# Patient Record
Sex: Female | Born: 2000 | Race: White | Hispanic: No | Marital: Single | Smoking: Never smoker
Health system: Southern US, Community
[De-identification: ages and names within clinical notes are randomized; demographics above are authoritative.]

## PROBLEM LIST (undated history)

## (undated) DIAGNOSIS — T7840XA Allergy, unspecified, initial encounter: Secondary | ICD-10-CM

## (undated) HISTORY — PX: NO PAST SURGERIES: SHX2092

---
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
## 2020-08-08 ENCOUNTER — Other Ambulatory Visit: Payer: Self-pay

## 2020-08-08 ENCOUNTER — Emergency Department (HOSPITAL_COMMUNITY)
Admission: EM | Admit: 2020-08-08 | Discharge: 2020-08-09 | Disposition: A | Discharge status: Home / Self Care | Payer: PPO | Attending: Emergency Medicine | Admitting: Emergency Medicine

## 2020-08-08 ENCOUNTER — Encounter (HOSPITAL_COMMUNITY): Payer: Self-pay

## 2020-08-08 ENCOUNTER — Emergency Department (HOSPITAL_COMMUNITY): Payer: PPO

## 2020-08-08 DIAGNOSIS — R079 Chest pain, unspecified: Secondary | ICD-10-CM | POA: Diagnosis present

## 2020-08-08 NOTE — ED Triage Notes (Signed)
Pt reports intermittent chest pain x2 weeks. Pt reports severe chest pain that began at 10p today. Pt denies abdominal pain and N/V/D.

## 2020-08-08 NOTE — ED Provider Notes (Signed)
MSE was initiated and I personally evaluated the patient and placed orders (if any) at  11:09 PM on August 08, 2020.  Patient with CP that started after driving [facility]  Has had gradually worsening/more frequent symptoms.  No medical problems.  No leg swelling.  No cough or fever.  Discussed with patient that their care has been initiated.   They are counseled that they will need to remain in the ED until the completion of their workup, including full H&P and results of any tests.  Risks of leaving the emergency department prior to completion of treatment were discussed. Patient was advised to inform ED staff if they are leaving before their treatment is complete. The patient acknowledged these risks and time was allowed for questions.    The patient appears stable so that the remainder of the MSE may be completed by another provider.    Roxy Horseman, PA-C 08/08/20 2309    Milagros Loll, MD 08/10/20 [phone]

## 2020-08-08 NOTE — ED Provider Notes (Signed)
[facility]-EMERGENCY DEPT Provider Note   CSN: [id number] Arrival date & time: 08/08/20  2240     History Chief Complaint  Patient presents with  . Chest Pain    Susan Russo is a 20 y.o. female.  Patient presents to the ED with a chief complaint of chest pain.  She states she has had the pain intermittently for the past couple of weeks.  She first noticed it after driving to [facility].  She states that it has been more frequent and worsened today.  She denies SOB, cough, or fever.  Denies hx of PE. Doesn't use birth control.  She doesn't associated the pain with anything.  Nothing makes it better or worse.  The history is provided by the patient. No language interpreter was used.       Past Medical History:  Diagnosis Date  . Allergy     Patient Active Problem List   Diagnosis Date Noted  . Sinus headache 02/08/2018  . Frequent headaches 02/08/2018  . Right foot pain 11/03/2017  . Pain in right shin 08/19/2017  . Seasonal allergic rhinitis due to pollen 12/08/2016  . Right-sided Bell's palsy 12/02/2016  . Family history of scoliosis 04/11/2013    Past Surgical History:  Procedure Laterality Date  . NO PAST SURGERIES       OB History   No obstetric history on file.     Family History  Problem Relation Age of Onset  . Anxiety disorder Mother   . Cancer Maternal Grandmother   . Migraines Maternal Grandmother   . Cancer Maternal Grandfather   . Cancer Paternal Grandfather   . Seizures Neg Hx   . Autism Neg Hx   . ADD / ADHD Neg Hx   . Depression Neg Hx   . Bipolar disorder Neg Hx   . Schizophrenia Neg Hx     Social History   Tobacco Use  . Smoking status: Never Smoker  . Smokeless tobacco: Never Used  Substance Use Topics  . Alcohol use: No    Home Medications Prior to Admission medications   Medication Sig Start Date End Date Taking? Authorizing Provider  acetaminophen (TYLENOL) 500 MG tablet Take 500 mg by  mouth every 6 (six) hours as needed for mild pain.    [provider]  azithromycin (ZITHROMAX) 200 MG/5ML suspension Take 200 mg by mouth daily.    [provider]  cetirizine (ZYRTEC) 10 MG tablet Take 10 mg by mouth as needed for allergies.  08/16/17   [provider]  fluticasone (FLONASE) 50 MCG/ACT nasal spray Place 2 sprays into both nostrils as needed for allergies.  08/14/17   [provider]  ibuprofen (ADVIL,MOTRIN) 200 MG tablet Take 400 mg by mouth every 6 (six) hours as needed for moderate pain.    [provider]  Pediatric Multivit-Minerals-C (CHILDRENS MULTIVITAMIN) 60 MG CHEW Chew 2 tablets by mouth at bedtime.    [provider]    Allergies    Patient has no known allergies.  Review of Systems   Review of Systems  All other systems reviewed and are negative.   Physical Exam Updated Vital Signs There were no vitals taken for this visit.  Physical Exam Vitals and nursing note reviewed.  Constitutional:      General: She is not in acute distress.    Appearance: She is well-developed.  HENT:     Head: Normocephalic and atraumatic.  Eyes:     Conjunctiva/sclera:  Conjunctivae normal.  Cardiovascular:     Rate and Rhythm: Normal rate and regular rhythm.     Heart sounds: No murmur heard.   Pulmonary:     Effort: Pulmonary effort is normal. No respiratory distress.     Breath sounds: Normal breath sounds.  Abdominal:     Palpations: Abdomen is soft.     Tenderness: There is no abdominal tenderness.  Musculoskeletal:        General: Normal range of motion.     Cervical back: Neck supple.  Skin:    General: Skin is warm and dry.  Neurological:     Mental Status: She is alert and oriented to person, place, and time.  Psychiatric:        Mood and Affect: Mood normal.        Behavior: Behavior normal.     ED Results / Procedures / Treatments   Labs (all labs ordered are listed, but only abnormal results  are displayed) Labs Reviewed  CBC WITH DIFFERENTIAL/PLATELET  BASIC METABOLIC PANEL  D-DIMER, QUANTITATIVE  HCG, QUANTITATIVE, PREGNANCY    EKG None  Radiology DG Chest 2 View  Result Date: 08/08/2020 CLINICAL DATA:  Chest pain EXAM: CHEST - 2 VIEW COMPARISON:  None. FINDINGS: The heart size and mediastinal contours are within normal limits. Both lungs are clear. The visualized skeletal structures are unremarkable. IMPRESSION: Normal study. Electronically Signed   By: Charlett Nose M.D.   On: 08/08/2020 23:39    Procedures Procedures   Medications Ordered in ED Medications - No data to display  ED Course  I have reviewed the triage vital signs and the nursing notes.  Pertinent labs & imaging results that were available during my care of the patient were reviewed by me and considered in my medical decision making (see chart for details).    MDM Rules/Calculators/A&P                          This patient complains of CP, this involves an extensive number of treatment options, and is a complaint that carries with it a high risk of complications and morbidity.    Differential Dx Anxiety, GERD, PE, pneumonia, covid  Pertinent Labs I ordered, reviewed, and interpreted labs, which included CBC normal, BMP is normal, hCG negative, D-dimer negative.  Imaging Interpretation I ordered imaging studies which included CXR.  I independently visualized and interpreted the CXR, which showed no obvious abnormality.    Reassessments After the interventions stated above, I reevaluated the patient and found feeling improved, no longer complaining of any pain.   Plan Discharge with outpatient follow-up.  Unclear explanation for the patient's symptoms, could be anxiety or GERD, but no evidence of serious chest pain etiology tonight.  Feel that patient is stable for discharge.     Final Clinical Impression(s) / ED Diagnoses Final diagnoses:  Nonspecific chest pain    Rx / DC  Orders ED Discharge Orders    None       Roxy Horseman, PA-C 08/09/20 0126    Melene Plan, DO 08/09/20 [id number]

## 2020-08-09 LAB — CBC WITH DIFFERENTIAL/PLATELET
Abs Immature Granulocytes: 0.01 10*3/uL (ref 0.00–0.07)
Basophils Absolute: 0 10*3/uL (ref 0.0–0.1)
Basophils Relative: 0 %
Eosinophils Absolute: 0.2 10*3/uL (ref 0.0–0.5)
Eosinophils Relative: 4 %
HCT: 37.2 % (ref 36.0–46.0)
Hemoglobin: 12.7 g/dL (ref 12.0–15.0)
Immature Granulocytes: 0 %
Lymphocytes Relative: 32 %
Lymphs Abs: 1.8 10*3/uL (ref 0.7–4.0)
MCH: 31.6 pg (ref 26.0–34.0)
MCHC: 34.1 g/dL (ref 30.0–36.0)
MCV: 92.5 fL (ref 80.0–100.0)
Monocytes Absolute: 0.5 10*3/uL (ref 0.1–1.0)
Monocytes Relative: 8 %
Neutro Abs: 3.1 10*3/uL (ref 1.7–7.7)
Neutrophils Relative %: 56 %
Platelets: 214 10*3/uL (ref 150–400)
RBC: 4.02 MIL/uL (ref 3.87–5.11)
RDW: 11.9 % (ref 11.5–15.5)
WBC: 5.6 10*3/uL (ref 4.0–10.5)
nRBC: 0 % (ref 0.0–0.2)

## 2020-08-09 LAB — BASIC METABOLIC PANEL
Anion gap: 7 (ref 5–15)
BUN: 16 mg/dL (ref 6–20)
CO2: 23 mmol/L (ref 22–32)
Calcium: 9.3 mg/dL (ref 8.9–10.3)
Chloride: 108 mmol/L (ref 98–111)
Creatinine, Ser: 0.97 mg/dL (ref 0.44–1.00)
GFR, Estimated: >60 mL/min (ref >60)
Glucose, Bld: 111 mg/dL — ABNORMAL HIGH (ref 70–99)
Potassium: 3.5 mmol/L (ref 3.5–5.1)
Sodium: 138 mmol/L (ref 135–145)

## 2020-08-09 LAB — HCG, QUANTITATIVE, PREGNANCY: hCG, Beta Chain, Quant, S: <1 m[IU]/mL (ref <5)

## 2020-08-09 LAB — D-DIMER, QUANTITATIVE: D-Dimer, Quant: 0.4 ug/mL-FEU (ref 0.00–0.50)

## 2020-09-09 ENCOUNTER — Encounter (HOSPITAL_BASED_OUTPATIENT_CLINIC_OR_DEPARTMENT_OTHER): Payer: Self-pay | Admitting: Emergency Medicine

## 2020-09-09 ENCOUNTER — Emergency Department (HOSPITAL_BASED_OUTPATIENT_CLINIC_OR_DEPARTMENT_OTHER)
Admission: EM | Admit: 2020-09-09 | Discharge: 2020-09-09 | Disposition: A | Discharge status: Home / Self Care | Payer: Medicaid Other | Attending: Emergency Medicine | Admitting: Emergency Medicine

## 2020-09-09 ENCOUNTER — Other Ambulatory Visit: Payer: Self-pay

## 2020-09-09 ENCOUNTER — Emergency Department (HOSPITAL_BASED_OUTPATIENT_CLINIC_OR_DEPARTMENT_OTHER): Payer: Medicaid Other

## 2020-09-09 DIAGNOSIS — R42 Dizziness and giddiness: Secondary | ICD-10-CM | POA: Insufficient documentation

## 2020-09-09 DIAGNOSIS — H538 Other visual disturbances: Secondary | ICD-10-CM | POA: Insufficient documentation

## 2020-09-09 DIAGNOSIS — R55 Syncope and collapse: Secondary | ICD-10-CM | POA: Diagnosis not present

## 2020-09-09 DIAGNOSIS — R109 Unspecified abdominal pain: Secondary | ICD-10-CM | POA: Insufficient documentation

## 2020-09-09 DIAGNOSIS — R079 Chest pain, unspecified: Secondary | ICD-10-CM

## 2020-09-09 DIAGNOSIS — R519 Headache, unspecified: Secondary | ICD-10-CM | POA: Insufficient documentation

## 2020-09-09 LAB — COMPREHENSIVE METABOLIC PANEL
ALT: 5 U/L (ref 0–44)
AST: 13 U/L — ABNORMAL LOW (ref 15–41)
Albumin: 4 g/dL (ref 3.5–5.0)
Alkaline Phosphatase: 37 U/L — ABNORMAL LOW (ref 38–126)
Anion gap: 9 (ref 5–15)
BUN: 14 mg/dL (ref 6–20)
CO2: 23 mmol/L (ref 22–32)
Calcium: 9.1 mg/dL (ref 8.9–10.3)
Chloride: 105 mmol/L (ref 98–111)
Creatinine, Ser: 0.72 mg/dL (ref 0.44–1.00)
GFR, Estimated: >60 mL/min (ref >60)
Glucose, Bld: 97 mg/dL (ref 70–99)
Potassium: 3.7 mmol/L (ref 3.5–5.1)
Sodium: 137 mmol/L (ref 135–145)
Total Bilirubin: 0.6 mg/dL (ref 0.3–1.2)
Total Protein: 6.1 g/dL — ABNORMAL LOW (ref 6.5–8.1)

## 2020-09-09 LAB — CBC WITH DIFFERENTIAL/PLATELET
Abs Immature Granulocytes: 0.01 10*3/uL (ref 0.00–0.07)
Basophils Absolute: 0 10*3/uL (ref 0.0–0.1)
Basophils Relative: 0 %
Eosinophils Absolute: 0.2 10*3/uL (ref 0.0–0.5)
Eosinophils Relative: 4 %
HCT: 35.4 % — ABNORMAL LOW (ref 36.0–46.0)
Hemoglobin: 12.2 g/dL (ref 12.0–15.0)
Immature Granulocytes: 0 %
Lymphocytes Relative: 26 %
Lymphs Abs: 1.3 10*3/uL (ref 0.7–4.0)
MCH: 31.2 pg (ref 26.0–34.0)
MCHC: 34.5 g/dL (ref 30.0–36.0)
MCV: 90.5 fL (ref 80.0–100.0)
Monocytes Absolute: 0.4 10*3/uL (ref 0.1–1.0)
Monocytes Relative: 7 %
Neutro Abs: 3.1 10*3/uL (ref 1.7–7.7)
Neutrophils Relative %: 63 %
Platelets: 209 10*3/uL (ref 150–400)
RBC: 3.91 MIL/uL (ref 3.87–5.11)
RDW: 11.8 % (ref 11.5–15.5)
WBC: 4.9 10*3/uL (ref 4.0–10.5)
nRBC: 0 % (ref 0.0–0.2)

## 2020-09-09 LAB — URINALYSIS, ROUTINE W REFLEX MICROSCOPIC
Bilirubin Urine: NEGATIVE
Glucose, UA: NEGATIVE mg/dL
Hgb urine dipstick: NEGATIVE
Ketones, ur: NEGATIVE mg/dL
Nitrite: NEGATIVE
Protein, ur: NEGATIVE mg/dL
Specific Gravity, Urine: 1.01 (ref 1.005–1.030)
pH: 6 (ref 5.0–8.0)

## 2020-09-09 LAB — URINALYSIS, MICROSCOPIC (REFLEX): RBC / HPF: NONE SEEN RBC/hpf (ref 0–5)

## 2020-09-09 LAB — PREGNANCY, URINE: Preg Test, Ur: NEGATIVE

## 2020-09-09 MED ORDER — SODIUM CHLORIDE 0.9 % IV BOLUS
1000.0000 mL | Freq: Once | INTRAVENOUS | Status: AC
  Administered 2020-09-09: 1000 mL via INTRAVENOUS

## 2020-09-09 NOTE — Discharge Instructions (Addendum)
You were seen in the emergency department for evaluation of lightheadedness like you might pass out with associated sweats and nausea.  You have also had some ongoing left and right-sided chest pain.  You had blood work EKG and a chest x-ray that did not show an obvious explanation for your symptoms.  Please contact your primary care doctor for close follow-up.  Return to the emergency department for any worsening or concerning symptoms

## 2020-09-09 NOTE — ED Triage Notes (Signed)
Pt arrives to ED with c/o awaking with left sided chest pain, cold sweats, blurred vision, headache, and anxiety. Pt states these symptoms lasted 10 minutes. Pt states recent dx of GAD and MDD and started on Buspirone. Pt states dull pain to left and right flanks over last month.

## 2020-09-09 NOTE — ED Provider Notes (Signed)
[facility] EMERGENCY DEPT Provider Note   CSN: [id number] Arrival date & time: 09/09/20  8592     History Chief Complaint  Patient presents with  . Flank Pain  . Chest Pain    Susan Russo is a 20 y.o. female.  She is presenting with a complaint of acute onset onset of lightheadedness blurry vision, feeling like she was, pass out, diaphoresis when she sat up from sleeping this morning.  She said this lasted about 10 minutes.  The last month and a half she has been troubled by some right-sided flank pain and right chest pain along with some left chest pain.  She is recently been put on buspirone and Robaxin and has been to the doctors multiple times during this period.  No fevers cough nausea vomiting diarrhea constipation or urinary symptoms.  Last menstrual period was 1 month ago.  The history is provided by the patient.  Flank Pain This is a new problem. Episode onset: 6 weeks. The problem occurs every several days. The problem has not changed since onset.Associated symptoms include chest pain and headaches. Pertinent negatives include no abdominal pain and no shortness of breath. Nothing aggravates the symptoms. Nothing relieves the symptoms. She has tried nothing for the symptoms. The treatment provided no relief.  Near Syncope This is a new problem. The current episode started 3 to 5 hours ago. The problem has been resolved. Associated symptoms include chest pain and headaches. Pertinent negatives include no abdominal pain and no shortness of breath. Nothing aggravates the symptoms. Nothing relieves the symptoms. She has tried rest for the symptoms. The treatment provided significant relief.       Past Medical History:  Diagnosis Date  . Allergy     Patient Active Problem List   Diagnosis Date Noted  . Sinus headache 02/08/2018  . Frequent headaches 02/08/2018  . Right foot pain 11/03/2017  . Pain in right shin 08/19/2017  . Seasonal allergic rhinitis  due to pollen 12/08/2016  . Right-sided Bell's palsy 12/02/2016  . Family history of scoliosis 04/11/2013    Past Surgical History:  Procedure Laterality Date  . NO PAST SURGERIES       OB History   No obstetric history on file.     Family History  Problem Relation Age of Onset  . Anxiety disorder Mother   . Cancer Maternal Grandmother   . Migraines Maternal Grandmother   . Cancer Maternal Grandfather   . Cancer Paternal Grandfather   . Seizures Neg Hx   . Autism Neg Hx   . ADD / ADHD Neg Hx   . Depression Neg Hx   . Bipolar disorder Neg Hx   . Schizophrenia Neg Hx     Social History   Tobacco Use  . Smoking status: Never Smoker  . Smokeless tobacco: Never Used  Substance Use Topics  . Alcohol use: No    Home Medications Prior to Admission medications   Medication Sig Start Date End Date Taking? Authorizing Provider  busPIRone (BUSPAR) 10 MG tablet Take 5-10 mg by mouth 2 (two) times daily as needed. 08/20/20  Yes [provider]  methocarbamol (ROBAXIN) 500 MG tablet Take 1-2 tablets by mouth 4 (four) times daily. 08/28/20  Yes [provider]  acetaminophen (TYLENOL) 500 MG tablet Take 500 mg by mouth every 6 (six) hours as needed for mild pain.    [provider]  azithromycin (ZITHROMAX) 200 MG/5ML suspension Take 200 mg by mouth daily.  [provider]  cetirizine (ZYRTEC) 10 MG tablet Take 10 mg by mouth as needed for allergies.  08/16/17   [provider]  fluticasone (FLONASE) 50 MCG/ACT nasal spray Place 2 sprays into both nostrils as needed for allergies.  08/14/17   [provider]  ibuprofen (ADVIL,MOTRIN) 200 MG tablet Take 400 mg by mouth every 6 (six) hours as needed for moderate pain.    [provider]  Pediatric Multivit-Minerals-C (CHILDRENS MULTIVITAMIN) 60 MG CHEW Chew 2 tablets by mouth at bedtime.    [provider]    Allergies    Patient has no known allergies.  Review  of Systems   Review of Systems  Constitutional: Positive for diaphoresis. Negative for fever.  HENT: Negative for sore throat.   Eyes: Positive for visual disturbance.  Respiratory: Negative for shortness of breath.   Cardiovascular: Positive for chest pain and near-syncope.  Gastrointestinal: Negative for abdominal pain.  Genitourinary: Positive for flank pain. Negative for dysuria.  Musculoskeletal: Positive for back pain.  Skin: Negative for rash.  Neurological: Positive for headaches.    Physical Exam Updated Vital Signs BP 113/77 (BP Location: Left Arm)   Pulse 77   Temp 98.4 F (36.9 C) (Oral)   Resp 14   Ht 5\' 5"  (1.651 m)   Wt 52.6 kg   SpO2 100%   BMI 19.30 kg/m   Physical Exam Vitals and nursing note reviewed.  Constitutional:      General: She is not in acute distress.    Appearance: Normal appearance. She is well-developed.  HENT:     Head: Normocephalic and atraumatic.  Eyes:     Conjunctiva/sclera: Conjunctivae normal.  Cardiovascular:     Rate and Rhythm: Normal rate and regular rhythm.     Heart sounds: Normal heart sounds. No murmur heard.   Pulmonary:     Effort: Pulmonary effort is normal. No respiratory distress.     Breath sounds: Normal breath sounds.  Abdominal:     Palpations: Abdomen is soft.     Tenderness: There is no abdominal tenderness.  Musculoskeletal:        General: Normal range of motion.     Cervical back: Neck supple.     Right lower leg: No edema.     Left lower leg: No edema.  Skin:    General: Skin is warm and dry.     Capillary Refill: Capillary refill takes less than 2 seconds.  Neurological:     General: No focal deficit present.     Mental Status: She is alert and oriented to person, place, and time.     Gait: Gait normal.     ED Results / Procedures / Treatments   Labs (all labs ordered are listed, but only abnormal results are displayed) Labs Reviewed  COMPREHENSIVE METABOLIC PANEL - Abnormal; Notable for  the following components:      Result Value   Total Protein 6.1 (*)    AST 13 (*)    Alkaline Phosphatase 37 (*)    All other components within normal limits  CBC WITH DIFFERENTIAL/PLATELET - Abnormal; Notable for the following components:   HCT 35.4 (*)    All other components within normal limits  URINALYSIS, ROUTINE W REFLEX MICROSCOPIC - Abnormal; Notable for the following components:   Color, Urine STRAW (*)    Leukocytes,Ua SMALL (*)    All other components within normal limits  URINALYSIS, MICROSCOPIC (REFLEX) - Abnormal; Notable for the following components:  Bacteria, UA FEW (*)    All other components within normal limits  PREGNANCY, URINE    EKG EKG Interpretation  Date/Time:  Wednesday Sep 09 2020 09:28:54 EDT Ventricular Rate:  86 PR Interval:  127 QRS Duration: 91 QT Interval:  356 QTC Calculation: 426 R Axis:   87 Text Interpretation: Sinus rhythm No significant change since last tracing Confirmed by Meridee Score [phone]) on 09/09/2020 9:41:54 AM   Radiology DG Chest Port 1 View  Result Date: 09/09/2020 CLINICAL DATA:  Chest pain. EXAM: PORTABLE CHEST 1 VIEW COMPARISON:  August 08, 2020. FINDINGS: The heart size and mediastinal contours are within normal limits. Both lungs are clear. No visible pleural effusions or pneumothorax. No acute osseous abnormality. IMPRESSION: No acute cardiopulmonary disease. Electronically Signed   By: Feliberto Harts MD   On: 09/09/2020 10:36    Procedures Procedures   Medications Ordered in ED Medications  sodium chloride 0.9 % bolus 1,000 mL (has no administration in time range)    ED Course  I have reviewed the triage vital signs and the nursing notes.  Pertinent labs & imaging results that were available during my care of the patient were reviewed by me and considered in my medical decision making (see chart for details).  Clinical Course as of 09/09/20 1653  Wed Sep 09, 2020  1037 Chest x-ray interpreted by me as  no acute infiltrates normal mediastinum [MB]  1135 Reviewed results with patient and her mother.  Recommended they follow back up with her PCP.  Return instructions discussed [MB]    Clinical Course User Index [MB] Terrilee Files, MD   MDM Rules/Calculators/A&P                         This patient complains of lightheadedness and near syncopal event today, ongoing left and right chest pain for the last month or so; this involves an extensive number of treatment Options and is a complaint that carries with it a high risk of complications and Morbidity. The differential includes vasovagal, hypovolemia, arrhythmia, anemia, metabolic derangement  I ordered, reviewed and interpreted labs, which included CBC with normal white count, hemoglobin similar to priors, chemistries fairly unremarkable, LFTs, urinalysis without obvious signs of infection in the setting of no urinary symptoms, pregnancy test negative I ordered medication IV fluids with improvement in her lightheadedness I ordered imaging studies which included chest x-ray and I independently    visualized and interpreted imaging which showed no acute findings Additional history obtained from patient's mother Previous records obtained and reviewed in epic, recent ED visit last month for nonspecific chest pain, D-dimer negative at that time  After the interventions stated above, I reevaluated the patient and found patient to be asymptomatic.  Recommended close follow-up with PCP.  Return instructions discussed   Final Clinical Impression(s) / ED Diagnoses Final diagnoses:  Near syncope  Nonspecific chest pain    Rx / DC Orders ED Discharge Orders    None       Terrilee Files, MD 09/09/20 1655

## 2020-09-19 ENCOUNTER — Encounter (HOSPITAL_COMMUNITY): Payer: Self-pay

## 2020-09-19 ENCOUNTER — Other Ambulatory Visit: Payer: Self-pay

## 2020-09-19 ENCOUNTER — Emergency Department (HOSPITAL_COMMUNITY): Payer: PPO

## 2020-09-19 ENCOUNTER — Emergency Department (HOSPITAL_COMMUNITY)
Admission: EM | Admit: 2020-09-19 | Discharge: 2020-09-19 | Disposition: A | Discharge status: Home / Self Care | Payer: PPO | Attending: Emergency Medicine | Admitting: Emergency Medicine

## 2020-09-19 DIAGNOSIS — Z8669 Personal history of other diseases of the nervous system and sense organs: Secondary | ICD-10-CM | POA: Insufficient documentation

## 2020-09-19 DIAGNOSIS — Y92410 Unspecified street and highway as the place of occurrence of the external cause: Secondary | ICD-10-CM | POA: Insufficient documentation

## 2020-09-19 DIAGNOSIS — R0781 Pleurodynia: Secondary | ICD-10-CM | POA: Diagnosis present

## 2020-09-19 DIAGNOSIS — R519 Headache, unspecified: Secondary | ICD-10-CM | POA: Insufficient documentation

## 2020-09-19 NOTE — Discharge Instructions (Signed)
Take NSAIDs or Tylenol as needed for the next week. Take this medicine with food. Use a heating pad for sore muscles - use for 20 minutes several times a day Try gentle range of motion exercises Follow up with your primary care provider  Return for worsening symptoms
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
## 2021-01-03 ENCOUNTER — Emergency Department (HOSPITAL_BASED_OUTPATIENT_CLINIC_OR_DEPARTMENT_OTHER)
Admission: EM | Admit: 2021-01-03 | Discharge: 2021-01-03 | Disposition: A | Discharge status: Home / Self Care | Payer: Medicaid Other | Attending: Emergency Medicine | Admitting: Emergency Medicine

## 2021-01-03 ENCOUNTER — Encounter (HOSPITAL_BASED_OUTPATIENT_CLINIC_OR_DEPARTMENT_OTHER): Payer: Self-pay | Admitting: Obstetrics and Gynecology

## 2021-01-03 ENCOUNTER — Other Ambulatory Visit: Payer: Self-pay

## 2021-01-03 DIAGNOSIS — R42 Dizziness and giddiness: Secondary | ICD-10-CM | POA: Diagnosis not present

## 2021-01-03 DIAGNOSIS — R519 Headache, unspecified: Secondary | ICD-10-CM | POA: Diagnosis not present

## 2021-01-03 DIAGNOSIS — Z5321 Procedure and treatment not carried out due to patient leaving prior to being seen by health care provider: Secondary | ICD-10-CM | POA: Insufficient documentation

## 2021-01-03 LAB — BASIC METABOLIC PANEL
Anion gap: 8 (ref 5–15)
BUN: 9 mg/dL (ref 6–20)
CO2: 25 mmol/L (ref 22–32)
Calcium: 9.3 mg/dL (ref 8.9–10.3)
Chloride: 104 mmol/L (ref 98–111)
Creatinine, Ser: 0.74 mg/dL (ref 0.44–1.00)
GFR, Estimated: >60 mL/min (ref >60)
Glucose, Bld: 99 mg/dL (ref 70–99)
Potassium: 4.1 mmol/L (ref 3.5–5.1)
Sodium: 137 mmol/L (ref 135–145)

## 2021-01-03 LAB — CBC
HCT: 38.7 % (ref 36.0–46.0)
Hemoglobin: 13.4 g/dL (ref 12.0–15.0)
MCH: 31.8 pg (ref 26.0–34.0)
MCHC: 34.6 g/dL (ref 30.0–36.0)
MCV: 91.7 fL (ref 80.0–100.0)
Platelets: 228 10*3/uL (ref 150–400)
RBC: 4.22 MIL/uL (ref 3.87–5.11)
RDW: 11.9 % (ref 11.5–15.5)
WBC: 4.7 10*3/uL (ref 4.0–10.5)
nRBC: 0 % (ref 0.0–0.2)

## 2021-01-03 LAB — URINALYSIS, ROUTINE W REFLEX MICROSCOPIC
Bilirubin Urine: NEGATIVE
Glucose, UA: NEGATIVE mg/dL
Ketones, ur: NEGATIVE mg/dL
Nitrite: NEGATIVE
Protein, ur: NEGATIVE mg/dL
Specific Gravity, Urine: 1.008 (ref 1.005–1.030)
pH: 7 (ref 5.0–8.0)

## 2021-01-03 LAB — PREGNANCY, URINE: Preg Test, Ur: NEGATIVE

## 2021-01-03 NOTE — ED Triage Notes (Signed)
Patient reports to the ER for right sided headache that is reportedly causing dizziness and worsens with positional change and trying to eat

## 2021-01-08 ENCOUNTER — Other Ambulatory Visit: Payer: Self-pay | Admitting: Physician Assistant

## 2021-01-08 ENCOUNTER — Other Ambulatory Visit: Payer: Self-pay

## 2021-01-08 ENCOUNTER — Ambulatory Visit: Payer: Medicaid Other

## 2021-01-08 DIAGNOSIS — R110 Nausea: Secondary | ICD-10-CM

## 2021-01-08 DIAGNOSIS — R42 Dizziness and giddiness: Secondary | ICD-10-CM

## 2021-01-08 DIAGNOSIS — R519 Headache, unspecified: Secondary | ICD-10-CM

## 2021-01-12 ENCOUNTER — Ambulatory Visit
Admission: RE | Admit: 2021-01-12 | Discharge: 2021-01-12 | Disposition: A | Discharge status: Home / Self Care | Payer: Medicaid Other | Source: Ambulatory Visit | Attending: Physician Assistant | Admitting: Physician Assistant

## 2021-01-12 ENCOUNTER — Other Ambulatory Visit: Payer: Self-pay

## 2021-01-12 DIAGNOSIS — R42 Dizziness and giddiness: Secondary | ICD-10-CM

## 2021-01-12 DIAGNOSIS — R110 Nausea: Secondary | ICD-10-CM

## 2021-01-12 DIAGNOSIS — R519 Headache, unspecified: Secondary | ICD-10-CM

## 2021-11-24 ENCOUNTER — Ambulatory Visit: Payer: Self-pay | Admitting: *Deleted

## 2021-11-24 NOTE — Telephone Encounter (Signed)
  Chief Complaint: bitten by cat Symptoms: headaches, stomach pain, nausea Frequency: off and on Pertinent Negatives: Patient denies fever Disposition: [] ED /[] Urgent Care (no appt availability in office) / [x] Appointment(In office/virtual)/ []  [facility]/ [] Home Care/ [] Refused Recommended Disposition /[] [facility]/ []  Follow-up with PCP Additional Notes: Pt has PCP in different system but does not have triage available. Told to call our triage to advise. Advised pt to make appt, ask about getting Tetanus shot, due in one year anyway. See if MD wants to test for rabies. Pt has unexplainable abd uneasiness, headache, feeling poorly. Pt states she will call and get appt with her PCP  Reason for Disposition  [1] Last tetanus shot > 5 years ago AND [2] any wound (e.g., cut, scrape)  Answer Assessment - Initial Assessment Questions 1. ANIMAL: "What type of animal caused the bite?" "Is the injury from a bite or a claw?" If the animal is a dog or a cat, ask: "Was it a pet or a stray?" "Was it acting ill or behaving strangely?"     Arm, little hole 2. LOCATION: "Where is the bite located?"      arm 3. SIZE: "How big is the bite?" "What does it look like?"      small 4. ONSET: "When did the bite happen?" (Minutes or hours ago)      Happened 5 days 5. CIRCUMSTANCES: "Tell me how this happened."      Bitten by neighbor cat who was behind on rabies vaccine 6. TETANUS: "When was your last tetanus booster?"     9 years ago 7. RABIES VACCINE: For dog or cat bites, ask: "Do you know if the pet is vaccinated against rabies?"  (e.g., yes, no, overdue for rabies shot, unknown)     3 months behind 8. PREGNANCY: "Is there any chance you are pregnant?" "When was your last menstrual period?"     no  Protocols used: Animal Bite-A-AH

## 2022-03-20 IMAGING — DX DG CHEST 1V PORT
1 series · 2 of 2 positions shown · non-contrast
Comparison: August 08, 2020.

CLINICAL DATA: Chest pain.

EXAM:
PORTABLE CHEST 1 VIEW

[Series 1: chest · 0.14mm/px · 2 of 2 slices shown]
[im 1/2]
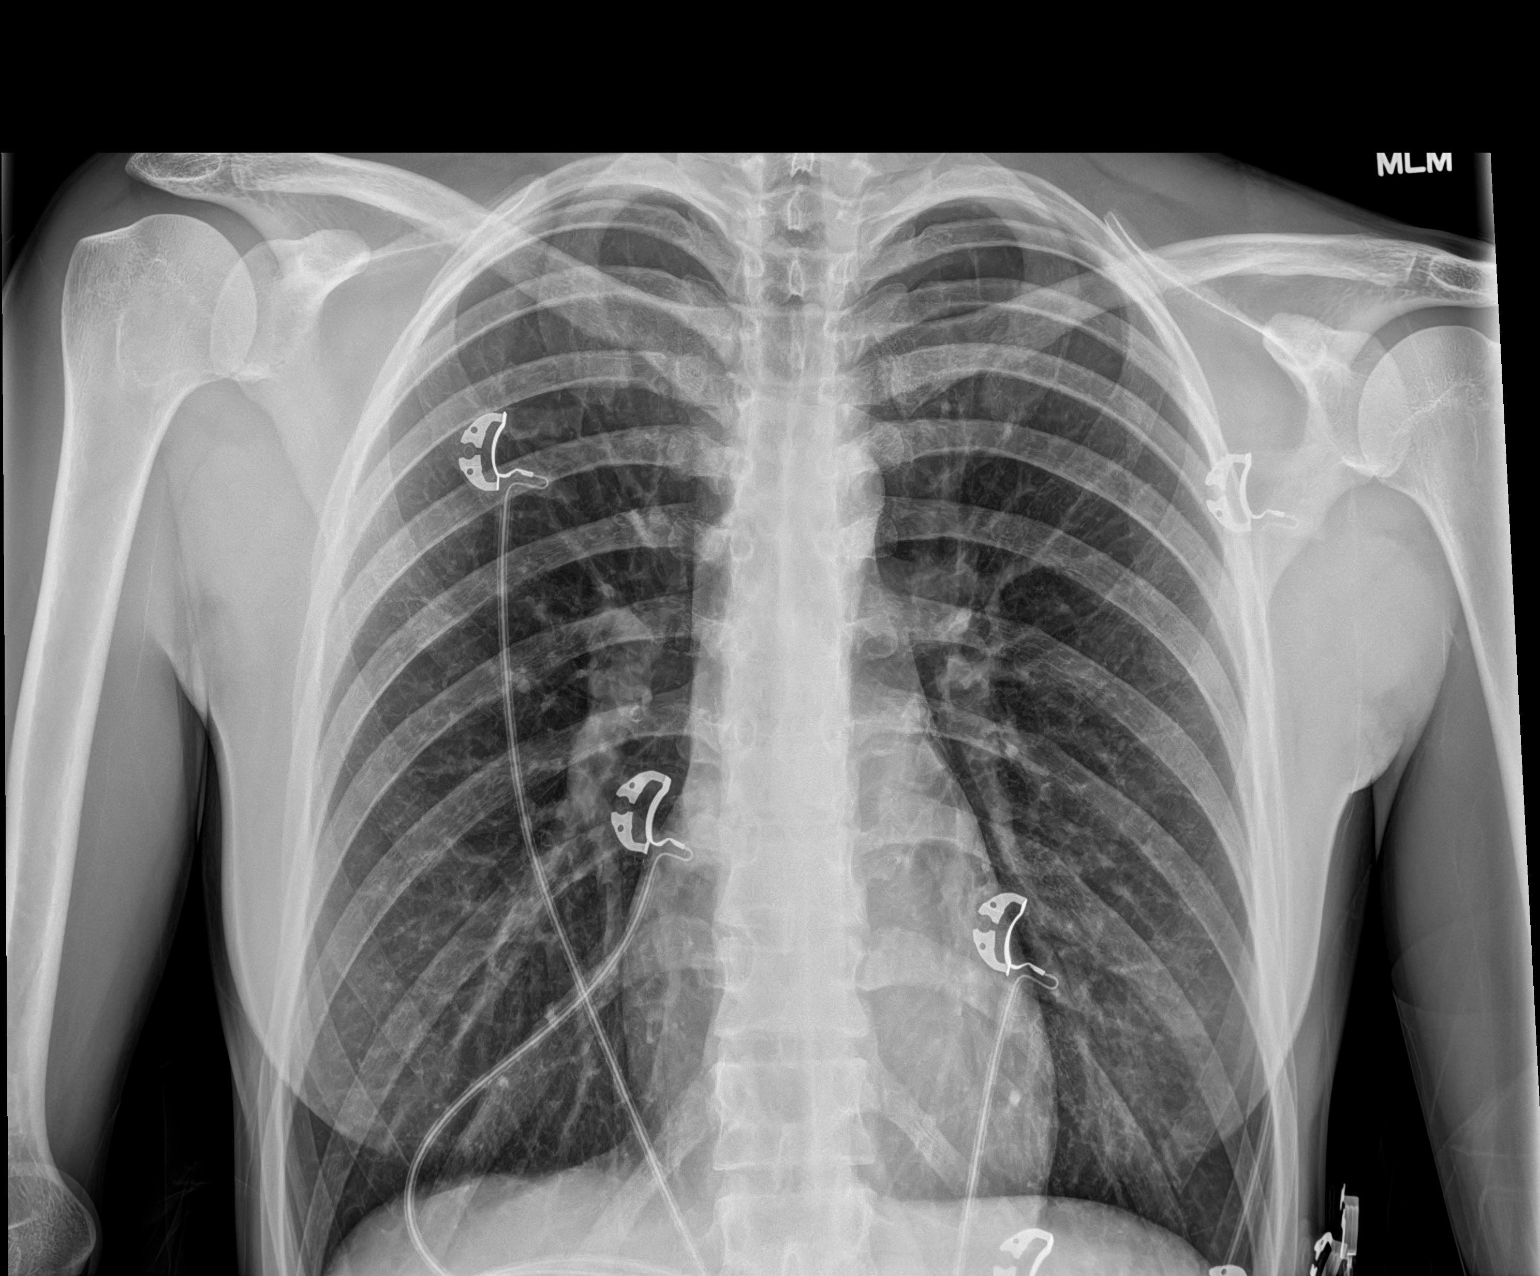
[im 2/2]
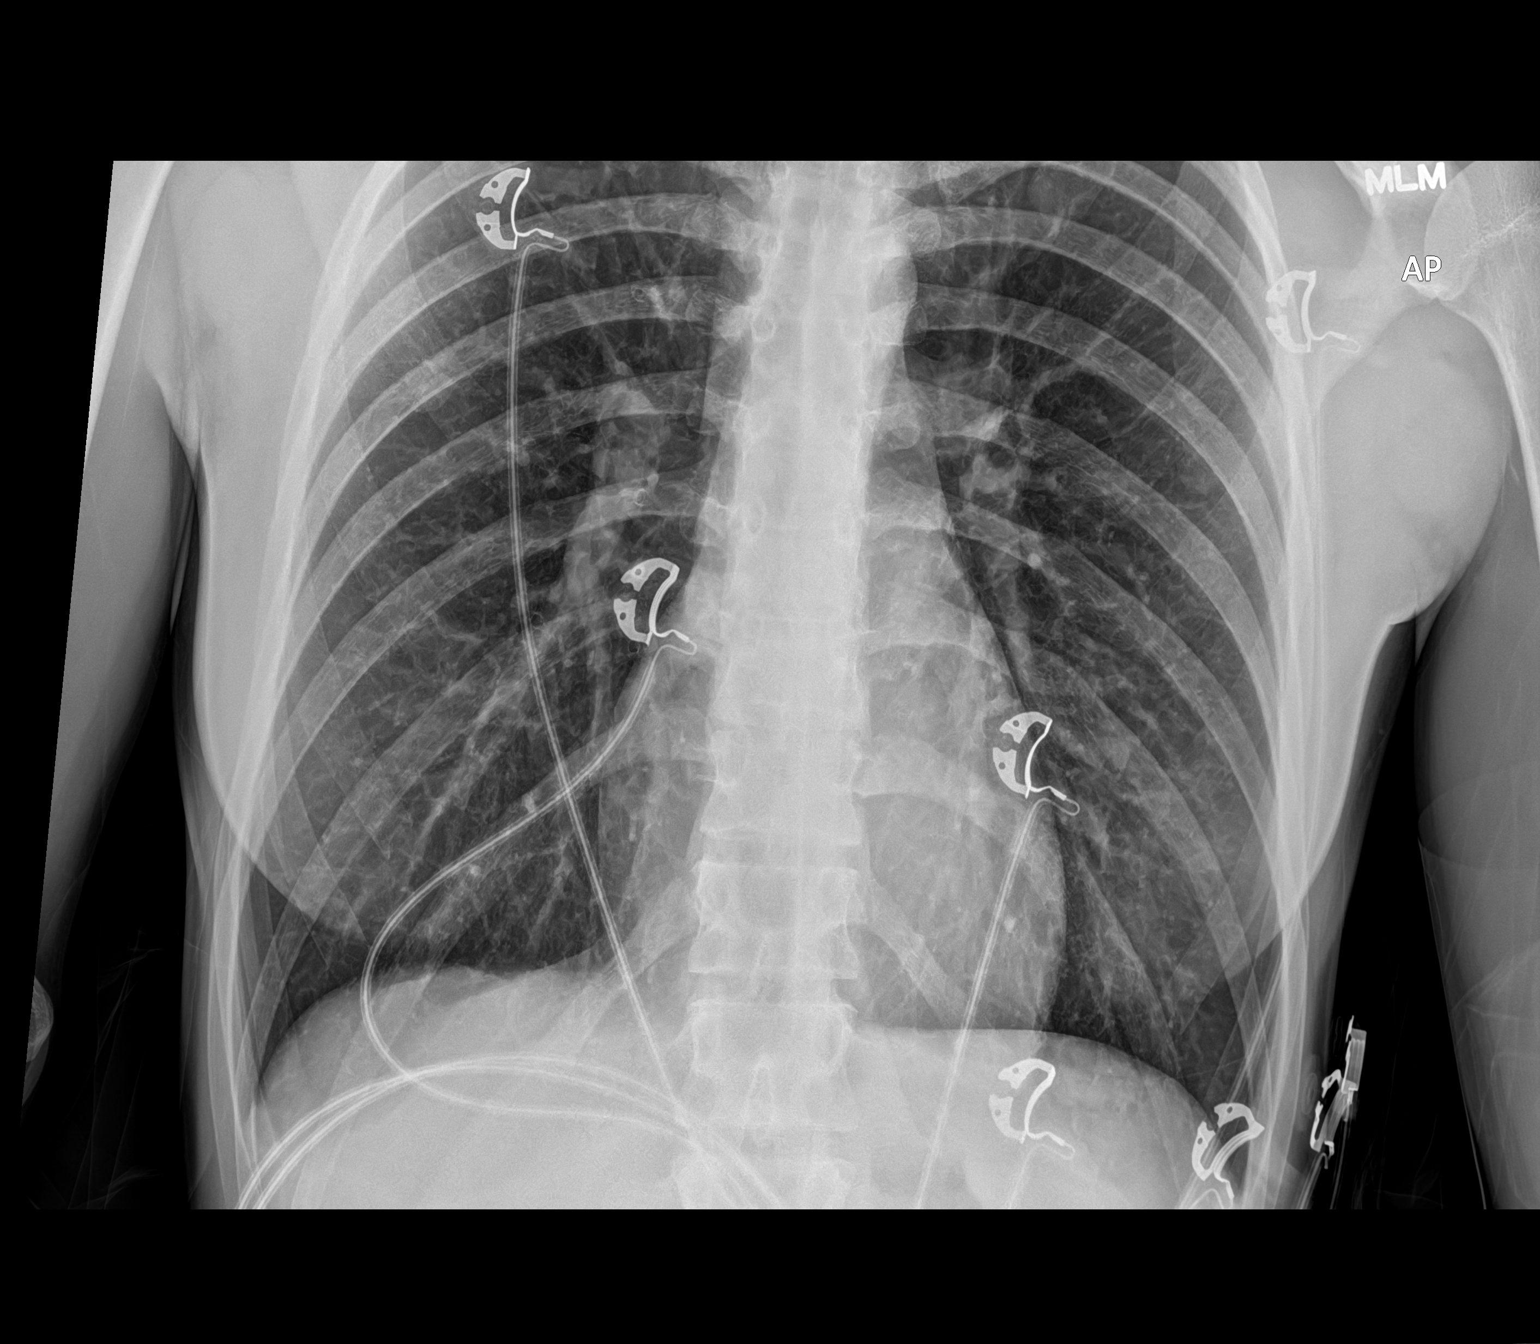

[2 of 2 positions shown; findings below may reference images not displayed]

FINDINGS: The heart size and mediastinal contours are within normal limits.
Both lungs are clear. No visible pleural effusions or pneumothorax.
No acute osseous abnormality.
IMPRESSION: No acute cardiopulmonary disease.
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
